# Patient Record
Sex: Male | Born: 1985 | Race: White | Hispanic: No | Marital: Married | Smoking: Never smoker
Health system: Southern US, Community
[De-identification: ages and names within clinical notes are randomized; demographics above are authoritative.]

---

## 2019-03-01 ENCOUNTER — Emergency Department (HOSPITAL_COMMUNITY): Payer: 59 | Admitting: Anesthesiology

## 2019-03-01 ENCOUNTER — Encounter (HOSPITAL_COMMUNITY)
Admission: EM | Disposition: A | Discharge status: Other Acute Inpatient Hospital | Payer: Self-pay | Source: Home / Self Care | Attending: Otolaryngology

## 2019-03-01 ENCOUNTER — Inpatient Hospital Stay (HOSPITAL_COMMUNITY)
Admission: EM | Admit: 2019-03-01 | Discharge: 2019-03-01 | DRG: 144 | Disposition: A | Discharge status: Other Acute Inpatient Hospital | Payer: 59 | Attending: Otolaryngology | Admitting: Otolaryngology

## 2019-03-01 ENCOUNTER — Inpatient Hospital Stay (HOSPITAL_COMMUNITY): Payer: 59

## 2019-03-01 ENCOUNTER — Emergency Department (HOSPITAL_COMMUNITY): Payer: 59

## 2019-03-01 ENCOUNTER — Other Ambulatory Visit: Payer: Self-pay

## 2019-03-01 ENCOUNTER — Encounter (HOSPITAL_COMMUNITY): Payer: Self-pay | Admitting: Emergency Medicine

## 2019-03-01 DIAGNOSIS — W1839XA Other fall on same level, initial encounter: Secondary | ICD-10-CM | POA: Diagnosis present

## 2019-03-01 DIAGNOSIS — S1121XA Laceration without foreign body of pharynx and cervical esophagus, initial encounter: Secondary | ICD-10-CM | POA: Diagnosis present

## 2019-03-01 DIAGNOSIS — Y92538 Other ambulatory health services establishments as the place of occurrence of the external cause: Secondary | ICD-10-CM | POA: Diagnosis present

## 2019-03-01 DIAGNOSIS — Z20828 Contact with and (suspected) exposure to other viral communicable diseases: Secondary | ICD-10-CM | POA: Diagnosis present

## 2019-03-01 DIAGNOSIS — Z880 Allergy status to penicillin: Secondary | ICD-10-CM

## 2019-03-01 DIAGNOSIS — Z419 Encounter for procedure for purposes other than remedying health state, unspecified: Secondary | ICD-10-CM

## 2019-03-01 DIAGNOSIS — T797XXA Traumatic subcutaneous emphysema, initial encounter: Secondary | ICD-10-CM | POA: Diagnosis present

## 2019-03-01 DIAGNOSIS — Y9389 Activity, other specified: Secondary | ICD-10-CM

## 2019-03-01 DIAGNOSIS — S1985XA Other specified injuries of pharynx and cervical esophagus, initial encounter: Secondary | ICD-10-CM

## 2019-03-01 DIAGNOSIS — R1310 Dysphagia, unspecified: Secondary | ICD-10-CM | POA: Diagnosis present

## 2019-03-01 DIAGNOSIS — J390 Retropharyngeal and parapharyngeal abscess: Secondary | ICD-10-CM | POA: Diagnosis present

## 2019-03-01 DIAGNOSIS — S1980XA Other specified injuries of unspecified part of neck, initial encounter: Secondary | ICD-10-CM

## 2019-03-01 DIAGNOSIS — Z978 Presence of other specified devices: Secondary | ICD-10-CM

## 2019-03-01 HISTORY — PX: ESOPHAGOSCOPY: SHX5534

## 2019-03-01 HISTORY — PX: DIRECT LARYNGOSCOPY: SHX5326

## 2019-03-01 LAB — MRSA PCR SCREENING: MRSA by PCR: NEGATIVE

## 2019-03-01 LAB — BASIC METABOLIC PANEL
Anion gap: 10 (ref 5–15)
BUN: 6 mg/dL (ref 6–20)
CO2: 25 mmol/L (ref 22–32)
Calcium: 8.6 mg/dL — ABNORMAL LOW (ref 8.9–10.3)
Chloride: 106 mmol/L (ref 98–111)
Creatinine, Ser: 0.74 mg/dL (ref 0.61–1.24)
GFR calc Af Amer: >60 mL/min (ref >60)
GFR calc non Af Amer: >60 mL/min (ref >60)
Glucose, Bld: 101 mg/dL — ABNORMAL HIGH (ref 70–99)
Potassium: 3.6 mmol/L (ref 3.5–5.1)
Sodium: 141 mmol/L (ref 135–145)

## 2019-03-01 LAB — LACTIC ACID, PLASMA: Lactic Acid, Venous: 0.9 mmol/L (ref 0.5–1.9)

## 2019-03-01 LAB — CBC WITH DIFFERENTIAL/PLATELET
Abs Immature Granulocytes: 0.04 10*3/uL (ref 0.00–0.07)
Basophils Absolute: 0 10*3/uL (ref 0.0–0.1)
Basophils Relative: 0 %
Eosinophils Absolute: 0 10*3/uL (ref 0.0–0.5)
Eosinophils Relative: 0 %
HCT: 35.7 % — ABNORMAL LOW (ref 39.0–52.0)
Hemoglobin: 12.3 g/dL — ABNORMAL LOW (ref 13.0–17.0)
Immature Granulocytes: 0 %
Lymphocytes Relative: 11 %
Lymphs Abs: 1.3 10*3/uL (ref 0.7–4.0)
MCH: 30.1 pg (ref 26.0–34.0)
MCHC: 34.5 g/dL (ref 30.0–36.0)
MCV: 87.3 fL (ref 80.0–100.0)
Monocytes Absolute: 1.1 10*3/uL — ABNORMAL HIGH (ref 0.1–1.0)
Monocytes Relative: 9 %
Neutro Abs: 9.4 10*3/uL — ABNORMAL HIGH (ref 1.7–7.7)
Neutrophils Relative %: 80 %
Platelets: 156 10*3/uL (ref 150–400)
RBC: 4.09 MIL/uL — ABNORMAL LOW (ref 4.22–5.81)
RDW: 11.9 % (ref 11.5–15.5)
WBC: 11.8 10*3/uL — ABNORMAL HIGH (ref 4.0–10.5)
nRBC: 0 % (ref 0.0–0.2)

## 2019-03-01 LAB — POCT I-STAT 7, (LYTES, BLD GAS, ICA,H+H)
Acid-Base Excess: 5 mmol/L — ABNORMAL HIGH (ref 0.0–2.0)
Bicarbonate: 28.8 mmol/L — ABNORMAL HIGH (ref 20.0–28.0)
Calcium, Ion: 1.14 mmol/L — ABNORMAL LOW (ref 1.15–1.40)
HCT: 33 % — ABNORMAL LOW (ref 39.0–52.0)
Hemoglobin: 11.2 g/dL — ABNORMAL LOW (ref 13.0–17.0)
O2 Saturation: 100 %
Patient temperature: 98.4
Potassium: 4.1 mmol/L (ref 3.5–5.1)
Sodium: 139 mmol/L (ref 135–145)
TCO2: 30 mmol/L (ref 22–32)
pCO2 arterial: 37.8 mmHg (ref 32.0–48.0)
pH, Arterial: 7.489 — ABNORMAL HIGH (ref 7.350–7.450)
pO2, Arterial: 474 mmHg — ABNORMAL HIGH (ref 83.0–108.0)

## 2019-03-01 LAB — RESPIRATORY PANEL BY RT PCR (FLU A&B, COVID)
Influenza A by PCR: NEGATIVE
Influenza B by PCR: NEGATIVE
SARS Coronavirus 2 by RT PCR: NEGATIVE

## 2019-03-01 SURGERY — LARYNGOSCOPY, DIRECT
Anesthesia: General | Site: Neck

## 2019-03-01 MED ORDER — MORPHINE SULFATE (PF) 4 MG/ML IV SOLN
4.0000 mg | Freq: Once | INTRAVENOUS | Status: AC
  Administered 2019-03-01: 4 mg via INTRAVENOUS
  Filled 2019-03-01: qty 1

## 2019-03-01 MED ORDER — FENTANYL CITRATE (PF) 100 MCG/2ML IJ SOLN: 50.0000 ug | Freq: Once | INTRAMUSCULAR | Status: DC

## 2019-03-01 MED ORDER — METRONIDAZOLE IN NACL 5-0.79 MG/ML-% IV SOLN: 500.0000 mg | Freq: Three times a day (TID) | INTRAVENOUS | Status: DC

## 2019-03-01 MED ORDER — LIDOCAINE-EPINEPHRINE 1 %-1:100000 IJ SOLN
INTRAMUSCULAR | Status: AC
  Filled 2019-03-01: qty 1

## 2019-03-01 MED ORDER — 0.9 % SODIUM CHLORIDE (POUR BTL) OPTIME
TOPICAL | Status: DC | PRN
  Administered 2019-03-01: 18:00:00 1000 mL

## 2019-03-01 MED ORDER — CEFAZOLIN SODIUM-DEXTROSE 2-4 GM/100ML-% IV SOLN
2.0000 g | Freq: Once | INTRAVENOUS | Status: AC
  Administered 2019-03-01: 2 g via INTRAVENOUS
  Filled 2019-03-01: qty 100

## 2019-03-01 MED ORDER — SODIUM CHLORIDE 0.9 % IV BOLUS
1000.0000 mL | Freq: Once | INTRAVENOUS | Status: AC
  Administered 2019-03-01: 1000 mL via INTRAVENOUS

## 2019-03-01 MED ORDER — WATER FOR IRRIGATION, STERILE IR SOLN
Status: DC | PRN
  Administered 2019-03-01: 1000 mL via SURGICAL_CAVITY

## 2019-03-01 MED ORDER — ORAL CARE MOUTH RINSE: 15.0000 mL | OROMUCOSAL | Status: DC

## 2019-03-01 MED ORDER — CLINDAMYCIN PHOSPHATE 900 MG/50ML IV SOLN
INTRAVENOUS | Status: AC
  Filled 2019-03-01: qty 50

## 2019-03-01 MED ORDER — FAMOTIDINE IN NACL 20-0.9 MG/50ML-% IV SOLN
20.0000 mg | Freq: Once | INTRAVENOUS | Status: AC
  Administered 2019-03-01: 20 mg via INTRAVENOUS
  Filled 2019-03-01: qty 50

## 2019-03-01 MED ORDER — CHLORHEXIDINE GLUCONATE 0.12% ORAL RINSE (MEDLINE KIT): 15.0000 mL | Freq: Two times a day (BID) | OROMUCOSAL | Status: DC

## 2019-03-01 MED ORDER — MIDAZOLAM HCL 2 MG/2ML IJ SOLN
INTRAMUSCULAR | Status: AC
  Filled 2019-03-01: qty 2

## 2019-03-01 MED ORDER — ONDANSETRON HCL 4 MG/2ML IJ SOLN
INTRAMUSCULAR | Status: AC
  Filled 2019-03-01: qty 2

## 2019-03-01 MED ORDER — METRONIDAZOLE IN NACL 5-0.79 MG/ML-% IV SOLN
500.0000 mg | Freq: Once | INTRAVENOUS | Status: AC
  Administered 2019-03-01: 500 mg via INTRAVENOUS
  Filled 2019-03-01: qty 100

## 2019-03-01 MED ORDER — PROPOFOL 1000 MG/100ML IV EMUL
INTRAVENOUS | Status: AC
  Filled 2019-03-01: qty 100

## 2019-03-01 MED ORDER — SODIUM CHLORIDE 0.9 % IV SOLN
2.0000 g | Freq: Three times a day (TID) | INTRAVENOUS | Status: DC
  Administered 2019-03-01: 2 g via INTRAVENOUS
  Filled 2019-03-01 (×4): qty 2

## 2019-03-01 MED ORDER — FENTANYL 2500MCG IN NS 250ML (10MCG/ML) PREMIX INFUSION
50.0000 ug/h | INTRAVENOUS | Status: DC
  Administered 2019-03-01: 50 ug/h via INTRAVENOUS
  Filled 2019-03-01: qty 250

## 2019-03-01 MED ORDER — PROPOFOL 10 MG/ML IV BOLUS
INTRAVENOUS | Status: DC | PRN
  Administered 2019-03-01: 30 mg via INTRAVENOUS
  Administered 2019-03-01: 50 ug/kg/min via INTRAVENOUS
  Administered 2019-03-01: 130 mg via INTRAVENOUS

## 2019-03-01 MED ORDER — ROCURONIUM BROMIDE 10 MG/ML (PF) SYRINGE
PREFILLED_SYRINGE | INTRAVENOUS | Status: AC
  Filled 2019-03-01: qty 10

## 2019-03-01 MED ORDER — ROCURONIUM BROMIDE 50 MG/5ML IV SOSY
PREFILLED_SYRINGE | INTRAVENOUS | Status: DC | PRN
  Administered 2019-03-01: 70 mg via INTRAVENOUS
  Administered 2019-03-01 (×2): 20 mg via INTRAVENOUS
  Administered 2019-03-01: 10 mg via INTRAVENOUS

## 2019-03-01 MED ORDER — LACTATED RINGERS IV SOLN: INTRAVENOUS | Status: DC | PRN

## 2019-03-01 MED ORDER — LIDOCAINE 2% (20 MG/ML) 5 ML SYRINGE
INTRAMUSCULAR | Status: AC
  Filled 2019-03-01: qty 5

## 2019-03-01 MED ORDER — CHLORHEXIDINE GLUCONATE CLOTH 2 % EX PADS: 6.0000 | MEDICATED_PAD | Freq: Every day | CUTANEOUS | Status: DC

## 2019-03-01 MED ORDER — LIDOCAINE-EPINEPHRINE 1 %-1:100000 IJ SOLN
INTRAMUSCULAR | Status: DC | PRN
  Administered 2019-03-01: 5.5 mL

## 2019-03-01 MED ORDER — LIDOCAINE 2% (20 MG/ML) 5 ML SYRINGE
INTRAMUSCULAR | Status: DC | PRN
  Administered 2019-03-01: 100 mg via INTRAVENOUS

## 2019-03-01 MED ORDER — DEXAMETHASONE SODIUM PHOSPHATE 10 MG/ML IJ SOLN
10.0000 mg | Freq: Once | INTRAMUSCULAR | Status: AC
  Administered 2019-03-01: 10 mg via INTRAVENOUS
  Filled 2019-03-01: qty 1

## 2019-03-01 MED ORDER — OXYMETAZOLINE HCL 0.05 % NA SOLN
NASAL | Status: DC | PRN
  Administered 2019-03-01: 1 via TOPICAL

## 2019-03-01 MED ORDER — FENTANYL CITRATE (PF) 250 MCG/5ML IJ SOLN
INTRAMUSCULAR | Status: AC
  Filled 2019-03-01: qty 5

## 2019-03-01 MED ORDER — ARTIFICIAL TEARS OPHTHALMIC OINT
TOPICAL_OINTMENT | OPHTHALMIC | Status: AC
  Filled 2019-03-01: qty 3.5

## 2019-03-01 MED ORDER — ARTIFICIAL TEARS OPHTHALMIC OINT
TOPICAL_OINTMENT | OPHTHALMIC | Status: DC | PRN
  Administered 2019-03-01: 1 via OPHTHALMIC

## 2019-03-01 MED ORDER — IOHEXOL 300 MG/ML  SOLN
75.0000 mL | Freq: Once | INTRAMUSCULAR | Status: AC | PRN
  Administered 2019-03-01: 75 mL via INTRAVENOUS

## 2019-03-01 MED ORDER — ONDANSETRON HCL 4 MG/2ML IJ SOLN
INTRAMUSCULAR | Status: DC | PRN
  Administered 2019-03-01: 4 mg via INTRAVENOUS

## 2019-03-01 MED ORDER — FENTANYL BOLUS VIA INFUSION
50.0000 ug | INTRAVENOUS | Status: DC | PRN
  Filled 2019-03-01: qty 50

## 2019-03-01 MED ORDER — FENTANYL CITRATE (PF) 100 MCG/2ML IJ SOLN
INTRAMUSCULAR | Status: DC | PRN
  Administered 2019-03-01: 50 ug via INTRAVENOUS
  Administered 2019-03-01: 150 ug via INTRAVENOUS
  Administered 2019-03-01: 50 ug via INTRAVENOUS

## 2019-03-01 MED ORDER — OXYMETAZOLINE HCL 0.05 % NA SOLN
NASAL | Status: AC
  Filled 2019-03-01: qty 30

## 2019-03-01 MED ORDER — MIDAZOLAM HCL 5 MG/5ML IJ SOLN
INTRAMUSCULAR | Status: DC | PRN
  Administered 2019-03-01 (×2): 2 mg via INTRAVENOUS

## 2019-03-01 MED ORDER — CLINDAMYCIN PHOSPHATE 900 MG/50ML IV SOLN
INTRAVENOUS | Status: DC | PRN
  Administered 2019-03-01: 900 mg via INTRAVENOUS

## 2019-03-01 MED ORDER — PROPOFOL 1000 MG/100ML IV EMUL
0.0000 ug/kg/min | INTRAVENOUS | Status: DC
  Filled 2019-03-01: qty 100

## 2019-03-01 SURGICAL SUPPLY — 32 items
BLADE 15 SAFETY STRL DISP (BLADE) ×4 IMPLANT
BNDG GAUZE ELAST 4 BULKY (GAUZE/BANDAGES/DRESSINGS) ×2 IMPLANT
CANISTER SUCT 3000ML PPV (MISCELLANEOUS) ×2 IMPLANT
CONT SPEC 4OZ CLIKSEAL STRL BL (MISCELLANEOUS) IMPLANT
COVER BACK TABLE 60X90IN (DRAPES) ×2 IMPLANT
COVER MAYO STAND STRL (DRAPES) ×2 IMPLANT
DRAPE HALF SHEET 40X57 (DRAPES) ×8 IMPLANT
DRAPE U-SHAPE 76X120 STRL (DRAPES) ×2 IMPLANT
DRAPE UNIVERSAL PACK (DRAPES) ×2 IMPLANT
FILTER STRAW FLUID ASPIR (MISCELLANEOUS) IMPLANT
GAUZE 4X4 16PLY RFD (DISPOSABLE) ×2 IMPLANT
GLOVE BIO SURGEON STRL SZ 6.5 (GLOVE) ×4 IMPLANT
GOWN SPEC L3 MED W/TWL (GOWN DISPOSABLE) ×8 IMPLANT
GUARD TEETH (MISCELLANEOUS) ×2 IMPLANT
KIT BASIN OR (CUSTOM PROCEDURE TRAY) ×2 IMPLANT
KIT TURNOVER KIT B (KITS) ×2 IMPLANT
NEEDLE FILTER BLUNT 18X 1/2SAF (NEEDLE)
NEEDLE FILTER BLUNT 18X1 1/2 (NEEDLE) IMPLANT
NEEDLE HYPO 25GX1X1/2 BEV (NEEDLE) ×2 IMPLANT
NS IRRIG 1000ML POUR BTL (IV SOLUTION) ×2 IMPLANT
PACK EENT II TURBAN DRAPE (CUSTOM PROCEDURE TRAY) ×2 IMPLANT
PAD ARMBOARD 7.5X6 YLW CONV (MISCELLANEOUS) ×4 IMPLANT
PATTIES SURGICAL .5 X.5 (GAUZE/BANDAGES/DRESSINGS) ×2 IMPLANT
SOL ANTI FOG 6CC (MISCELLANEOUS) ×1 IMPLANT
SOLUTION ANTI FOG 6CC (MISCELLANEOUS) ×1
SURGILUBE 2OZ TUBE FLIPTOP (MISCELLANEOUS) ×2 IMPLANT
SWAB CULTURE ESWAB REG 1ML (MISCELLANEOUS) ×4 IMPLANT
SYR 5ML LL (SYRINGE) ×2 IMPLANT
SYR CONTROL 10ML LL (SYRINGE) ×4 IMPLANT
TOWEL GREEN STERILE FF (TOWEL DISPOSABLE) ×4 IMPLANT
TUBE CONNECTING 12X1/4 (SUCTIONS) ×2 IMPLANT
WATER STERILE IRR 1000ML POUR (IV SOLUTION) ×2 IMPLANT

## 2019-03-01 NOTE — Consult Note (Addendum)
OTOLARYNGOLOGY CONSULTATION   Primary Care Physician: Patient, No Pcp Per Patient Location at Initial Consult: Emergency Department Chief Complaint/Reason for Consult: Odynophagia, concern for pharyngeal injury  History of Presenting Illness:    Thomas Higgins is a  33 y.o. male presenting with throat pain and odynophagia.  [facility] 02/26/2019 [facility] when he stood up and then had a syncopal episode hitting his anterior neck on a table/desk.  Since that time he has had difficulty swallowing.  There have been no voice changes.  There is no difficulty with breathing or shortness of breath.  He has not had fever night sweats or chills.  No history of esophageal problems or injuries.  He has a very difficult time swallowing and prefers to spit though he is able to swallow.  It is just very painful in the lower neck.  He is otherwise in good health.  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  History reviewed. No pertinent past medical history.  History reviewed. No pertinent surgical history.  No family history on file.  Social History   Socioeconomic History  . Marital status: Married    Spouse name: Not on file  . Number of children: Not on file  . Years of education: Not on file  . Highest education level: Not on file  Occupational History  . Not on file  Tobacco Use  . Smoking status: Never Smoker  . Smokeless tobacco: Never Used  Substance and Sexual Activity  . Alcohol use: Never  . Drug use: Never  . Sexual activity: Not on file  Other Topics Concern  . Not on file  Social History Narrative  . Not on file   Social Determinants of Health   Financial Resource Strain:   . Difficulty of Paying Living Expenses: Not on file  Food Insecurity:   . Worried About Programme researcher, broadcasting/film/videounning Out of Food in the Last Year: Not on file  . Ran Out of Food in the Last Year: Not on file  Transportation Needs:   . Lack of Transportation (Medical): Not on file  . Lack of  Transportation (Non-Medical): Not on file  Physical Activity:   . Days of Exercise per Week: Not on file  . Minutes of Exercise per Session: Not on file  Stress:   . Feeling of Stress : Not on file  Social Connections:   . Frequency of Communication with Friends and Family: Not on file  . Frequency of Social Gatherings with Friends and Family: Not on file  . Attends Religious Services: Not on file  . Active Member of Clubs or Organizations: Not on file  . Attends BankerClub or Organization Meetings: Not on file  . Marital Status: Not on file    No current facility-administered medications on file prior to encounter.   No current outpatient medications on file prior to encounter.    Allergies  Allergen Reactions  . Penicillins Rash     Review of Systems: Review of systems complete and is negative except for the above-mentioned.    OBJECTIVE: Vital Signs: Vitals:   03/01/19 0738  BP: 121/73  Pulse: 94  Resp: 14  Temp: 97.7 F (36.5 C)  SpO2: 97%    I&O No intake or output data in the 24 hours ending 03/01/19 1403  Physical Exam General: Well developed, well nourished. No acute distress. Voice without dysphonia.   Head/Face: Normocephalic, atraumatic. No scars or lesions. No sinus tenderness. Facial nerve intact and equal  bilaterally.  No facial lacerations. Salivary glands non tender and without palpable masses  Eyes: Globes well positioned, no proptosis Lids: No periorbital edema/ecchymosis. No lid laceration Conjunctiva: No chemosis, hemorrhage PERRL Extra occular movement: Full ROM bilaterally. No gaze restriction    Ears: No gross deformity. Normal external canal. Tympanic membrane intact bilaterally and without effusion  Hearing:  Normal speech reception.  Nose: No gross deformity or lesions. No purulent discharge. Septum midline. No turbinate hypertrophy.  Mouth/Oropharynx: Lips without any lesions. Dentition normal. No mucosal lesions within the oropharynx. No  tonsillar enlargement, exudate, or lesions. Pharyngeal walls symmetrical. Uvula midline. Tongue midline without lesions.  Neck: Trachea midline. No masses. No thyromegaly or nodules palpated. No crepitus. There is a small excoriation in the neck extending just to the right of midline that is clean dry and intact.  Lymphatic: No lymphadenopathy in the neck.  Respiratory:  Normal voice.  No dysphonia or stridor.  Symmetric chest wall expansion.  Cardiovascular: Regular rate and rhythm.  Extremities: No edema or cyanosis. Warm and well-perfused.  Skin: No scars or lesions on face or neck.  Neurologic: CN II-XII intact. Moving all extremities without gross abnormality.  Other:      Labs: No results found for: WBC, HGB, HCT, PLT, CHOL, TRIG, HDL, LDLDIRECT, ALT, AST, NA, K, CL, CREATININE, BUN, CO2, TSH, PSA, INR, GLUF, HGBA1C, MICROALBUR  Procedure: Transnasal Flexible Laryngoscopy  Preoperative Diagnosis: Pharyngeal injury Postoperative Diagnosis: Same Procedure: Transnasal fiberoptic laryngoscopy.  Estimated Blood Loss: 0 mL.  Complications: None.  Findings: The nasal cavity and nasopharynx are unremarkable. There are no suspicious findings in the nasopharynx or Fossa of Rosenmller. The tongue base, pharyngeal walls, piriform sinuses, vallecula, epiglottis and postcricoid region are normal in appearance. The visualized portion of the subglottis and proximal trachea is widely patent. The vocal folds are mobile bilaterally. There are no lesions or significant inflammation. There is no glottal insufficiency. There is no pooling of secretions or aspiration. There is swelling along the posterior pharynx more pronounced on the right at approximately the level of the epiglottis.  However, the epiglottis and supraglottic structures and airway remained completely normal in appearance and without any edema.  Description of Procedure: With the patient in the sitting position, topical  Afrin-lidocaine  mixture  was applied to the nose. The scope was passed through the nose. Examination was carried out of the nose, nasopharynx, oropharynx, hypopharynx, and larynx with findings as noted above. Scope was removed.  The patient tolerated the procedure well.      Review of Ancillary Data / Diagnostic Tests: CT neck personally reviewed.  There appears to be likely a right pharyngeal injury with subsequent subcutaneous emphysema and retropharyngeal fluid collection extending into the retropharyngeal space and abutting the carotid spaces, right more so than left.  Extraluminal air extends to the thoracic inlet but not beyond this.  There is no pneumomediastinum obvious on my exam.  There is no rim enhancement to this fluid collection.  ASSESSMENT:  33 y.o. male with odynophagia after pharyngeal injury from syncopal episode after donating blood on 02/26/2019.  He is afebrile.  RECOMMENDATIONS: -Strict NPO for at least the next 72 hours. Will obtain esophagram later today and prior to any feeding trials in a few days if clinically improved.  He will need a Dobbhoff feeding tube which I will place in the operating room under direct visualization.  We will plan for direct laryngoscopy and esophagoscopy. Possible external incision and drainage if there is active purulence coming  from the retropharyngeal perforation.  We discussed risks and benefits of surgery including risks of anesthesia, airway compromise, need for further surgeries, risk of damage to lips teeth or tongue, esophageal perforation and potential pneumothorax.  He expresses understanding and gave informed consent. -He will be admitted and placed on IV antibiotics.  I have called and personally discussed this case with Dr. Johnnye Sima, infectious disease.  We will proceed with Unasyn as antibiotic management for now.     Gavin Pound, MD  [facility], [place] Office phone  [phone]

## 2019-03-01 NOTE — ED Provider Notes (Signed)
[facility] Provider Note   CSN: [id number] Arrival date & time: 03/01/19  [id number]     History Chief Complaint  Patient presents with  . throat pain s/p fall    Thomas Higgins is a 33 y.o. male.  Patient s/p syncopal event post donating blood 12/24, c/o anterior neck pain s/p neck contusion. With syncopal event, hit anterior neck on edge of counter. Since then, constant pain to area, worse w swallowing. States area feels swollen. Symptoms acute onset with contusion to neck, constant, persistent, moderate, dull, non radiating. Is able to swallow since, but painful, and mainly sticking to liquids. No choking, gagging or coughing. No trouble breathing. Denies headache. No posterior neck pain or back pain. Denies any other pain or injury. w syncopal event, felt somewhat lightheaded post donating blood, felt a bit better, tried to get up/walk, and had fainting episode. No associated palpitations or chest pain/discomfort.   The history is provided by the patient.       History reviewed. No pertinent past medical history.  There are no problems to display for this patient.   History reviewed. No pertinent surgical history.     No family history on file.  Social History   Tobacco Use  . Smoking status: Never Smoker  . Smokeless tobacco: Never Used  Substance Use Topics  . Alcohol use: Never  . Drug use: Never    Home Medications Prior to Admission medications   Not on File    Allergies    Patient has no allergy information on record.  Review of Systems   Review of Systems  Constitutional: Negative for fever.  HENT: Negative for voice change.   Eyes: Negative for redness.  Respiratory: Negative for cough and shortness of breath.   Cardiovascular: Negative for chest pain.  Gastrointestinal: Negative for abdominal pain and vomiting.  Genitourinary: Negative for flank pain.  Musculoskeletal: Negative for back pain.       No spine  or posterior neck pain.   Skin: Negative for rash.  Neurological: Negative for weakness, numbness and headaches.  Hematological: Does not bruise/bleed easily.  Psychiatric/Behavioral: Negative for confusion.    Physical Exam Updated Vital Signs BP 121/73 (BP Location: Left Arm)   Pulse 94   Temp 97.7 F (36.5 C) (Oral)   Resp 14   SpO2 97%   Physical Exam Vitals and nursing note reviewed.  Constitutional:      Appearance: Normal appearance. He is well-developed.  HENT:     Head: Atraumatic.     Nose: Nose normal.     Mouth/Throat:     Mouth: Mucous membranes are moist.     Pharynx: Oropharynx is clear. No oropharyngeal exudate or posterior oropharyngeal erythema.  Eyes:     Conjunctiva/sclera: Conjunctivae normal.  Neck:     Trachea: No tracheal deviation.     Comments: Trachea midline. No crepitus. No bruits.  Cardiovascular:     Rate and Rhythm: Normal rate and regular rhythm.     Pulses: Normal pulses.     Heart sounds: Normal heart sounds. No murmur. No friction rub. No gallop.   Pulmonary:     Effort: Pulmonary effort is normal. No accessory muscle usage or respiratory distress.     Breath sounds: Normal breath sounds. No stridor.  Abdominal:     General: There is no distension.     Palpations: Abdomen is soft.     Tenderness: There is no abdominal tenderness.  Genitourinary:  Comments: No cva tenderness. Musculoskeletal:        General: No swelling.     Cervical back: Normal range of motion and neck supple. No rigidity.  Skin:    General: Skin is warm and dry.     Findings: No rash.  Neurological:     Mental Status: He is alert.     Comments: Alert, speech clear/normal. Steady gait.   Psychiatric:        Mood and Affect: Mood normal.     ED Results / Procedures / Treatments   Labs (all labs ordered are listed, but only abnormal results are displayed) Results for orders placed or performed during the hospital encounter of 03/01/19  Lactic acid    Result Value Ref Range   Lactic Acid, Venous 0.9 0.5 - 1.9 mmol/L  CBC with Differential  Result Value Ref Range   WBC 11.8 (H) 4.0 - 10.5 K/uL   RBC 4.09 (L) 4.22 - 5.81 MIL/uL   Hemoglobin 12.3 (L) 13.0 - 17.0 g/dL   HCT 35.7 (L) 39.0 - 52.0 %   MCV 87.3 80.0 - 100.0 fL   MCH 30.1 26.0 - 34.0 pg   MCHC 34.5 30.0 - 36.0 g/dL   RDW 11.9 11.5 - 15.5 %   Platelets 156 150 - 400 K/uL   nRBC 0.0 0.0 - 0.2 %   Neutrophils Relative % 80 %   Neutro Abs 9.4 (H) 1.7 - 7.7 K/uL   Lymphocytes Relative 11 %   Lymphs Abs 1.3 0.7 - 4.0 K/uL   Monocytes Relative 9 %   Monocytes Absolute 1.1 (H) 0.1 - 1.0 K/uL   Eosinophils Relative 0 %   Eosinophils Absolute 0.0 0.0 - 0.5 K/uL   Basophils Relative 0 %   Basophils Absolute 0.0 0.0 - 0.1 K/uL   Immature Granulocytes 0 %   Abs Immature Granulocytes 0.04 0.00 - 0.07 K/uL  BMET  Result Value Ref Range   Sodium 141 135 - 145 mmol/L   Potassium 3.6 3.5 - 5.1 mmol/L   Chloride 106 98 - 111 mmol/L   CO2 25 22 - 32 mmol/L   Glucose, Bld 101 (H) 70 - 99 mg/dL   BUN 6 6 - 20 mg/dL   Creatinine, Ser 0.74 0.61 - 1.24 mg/dL   Calcium 8.6 (L) 8.9 - 10.3 mg/dL   GFR calc non Af Amer >60 >60 mL/min   GFR calc Af Amer >60 >60 mL/min   Anion gap 10 5 - 15   CT Soft Tissue Neck W Contrast  Result Date: 03/01/2019 CLINICAL DATA:  33 year old male status post syncope while donating blood and fall striking anterior neck on counter top. Painful swallowing. EXAM: CT NECK WITH CONTRAST TECHNIQUE: Multidetector CT imaging of the neck was performed using the standard protocol following the bolus administration of intravenous contrast. CONTRAST:  79mL OMNIPAQUE IOHEXOL 300 MG/ML  SOLN COMPARISON:  None. FINDINGS: Pharynx and larynx: The hyoid bone and thyroid cartilage seem to remain intact. However, there is abnormal retropharyngeal space fluid and gas tracking throughout the neck. See sagittal image 57. The retropharyngeal effusion and/or edema tracks laterally  from the retropharyngeal space to involve both carotid spaces. See series 3, image 49. Gas tracks inferiorly to the thoracic inlet mostly along the cervical esophagus, but the volume of gas dissipates caudally and there is no superior pneumomediastinum. There is an unusual contour of the posterior wall of the lower oropharynx or hypopharynx on series 3, image 47. But otherwise no  discrete pharyngeal or laryngeal contour abnormality. Laryngeal cartilages appear symmetric and within normal limits. There is mild inflammation in the superior right parapharyngeal space, where as the superior left parapharyngeal space remains normal. Salivary glands: Negative sublingual space, sublingual glands. The submandibular glands appear symmetric and within normal limits although there is some fluid or edema in the posterior right submandibular space. Parotid glands appear symmetric and negative. Thyroid: Negative aside from trace adjacent gas. Lymph nodes: Right level 2 lymph nodes are asymmetrically enlarged and appear reactive, individually up to 11 millimeters short axis. No cystic or necrotic nodes. Vascular: Good intravascular contrast. Three vessel arch configuration. Right CCA and carotid bifurcation are normal. Cervical right ICA is within normal limits. Left CCA and left carotid bifurcation are normal. Cervical left ICA is within normal limits. Both internal jugular veins are patent. The vertebral arteries are codominant and appear within normal limits to the skull base. The right vertebral has a late entry into the cervical transverse foramen. Limited intracranial: Negative. Visualized orbits: Negative. Mastoids and visualized paranasal sinuses: Clear throughout. Skeleton: Mandible intact. No acute dental abnormality identified. Straightening of cervical lordosis but otherwise normal cervical vertebral alignment. No osseous abnormality identified. Upper chest: No pneumothorax in the visible upper lungs are clear.  Negative visible superior mediastinum, no fluid or pneumomediastinum. IMPRESSION: 1. Positive for evidence of penetrating trauma to the pharynx or larynx with abnormal retropharyngeal space gas and fluid/edema tracking throughout the neck. The gas dissipates at the thoracic inlet and the visible upper chest is negative. The injury site might be the posterior pharyngeal wall to the right of midline on series 3, image 47 where an unusual mucosal contour is noted. The hyoid bone and thyroid cartilage seem to remain intact. 2. Secondary inflammation and/or fluid extending laterally from the retropharyngeal space into the bilateral parapharyngeal and carotid spaces. No vascular abnormality identified. Mild inflammation also in the right submandibular space. 3. No other acute traumatic injury identified. Electronically Signed   By: Odessa Fleming M.D.   On: 03/01/2019 11:02    EKG None  Radiology CT Soft Tissue Neck W Contrast  Result Date: 03/01/2019 CLINICAL DATA:  33 year old male status post syncope while donating blood and fall striking anterior neck on counter top. Painful swallowing. EXAM: CT NECK WITH CONTRAST TECHNIQUE: Multidetector CT imaging of the neck was performed using the standard protocol following the bolus administration of intravenous contrast. CONTRAST:  75mL OMNIPAQUE IOHEXOL 300 MG/ML  SOLN COMPARISON:  None. FINDINGS: Pharynx and larynx: The hyoid bone and thyroid cartilage seem to remain intact. However, there is abnormal retropharyngeal space fluid and gas tracking throughout the neck. See sagittal image 57. The retropharyngeal effusion and/or edema tracks laterally from the retropharyngeal space to involve both carotid spaces. See series 3, image 49. Gas tracks inferiorly to the thoracic inlet mostly along the cervical esophagus, but the volume of gas dissipates caudally and there is no superior pneumomediastinum. There is an unusual contour of the posterior wall of the lower oropharynx or  hypopharynx on series 3, image 47. But otherwise no discrete pharyngeal or laryngeal contour abnormality. Laryngeal cartilages appear symmetric and within normal limits. There is mild inflammation in the superior right parapharyngeal space, where as the superior left parapharyngeal space remains normal. Salivary glands: Negative sublingual space, sublingual glands. The submandibular glands appear symmetric and within normal limits although there is some fluid or edema in the posterior right submandibular space. Parotid glands appear symmetric and negative. Thyroid: Negative aside from trace adjacent gas.  Lymph nodes: Right level 2 lymph nodes are asymmetrically enlarged and appear reactive, individually up to 11 millimeters short axis. No cystic or necrotic nodes. Vascular: Good intravascular contrast. Three vessel arch configuration. Right CCA and carotid bifurcation are normal. Cervical right ICA is within normal limits. Left CCA and left carotid bifurcation are normal. Cervical left ICA is within normal limits. Both internal jugular veins are patent. The vertebral arteries are codominant and appear within normal limits to the skull base. The right vertebral has a late entry into the cervical transverse foramen. Limited intracranial: Negative. Visualized orbits: Negative. Mastoids and visualized paranasal sinuses: Clear throughout. Skeleton: Mandible intact. No acute dental abnormality identified. Straightening of cervical lordosis but otherwise normal cervical vertebral alignment. No osseous abnormality identified. Upper chest: No pneumothorax in the visible upper lungs are clear. Negative visible superior mediastinum, no fluid or pneumomediastinum. IMPRESSION: 1. Positive for evidence of penetrating trauma to the pharynx or larynx with abnormal retropharyngeal space gas and fluid/edema tracking throughout the neck. The gas dissipates at the thoracic inlet and the visible upper chest is negative. The injury site  might be the posterior pharyngeal wall to the right of midline on series 3, image 47 where an unusual mucosal contour is noted. The hyoid bone and thyroid cartilage seem to remain intact. 2. Secondary inflammation and/or fluid extending laterally from the retropharyngeal space into the bilateral parapharyngeal and carotid spaces. No vascular abnormality identified. Mild inflammation also in the right submandibular space. 3. No other acute traumatic injury identified. Electronically Signed   By: Odessa Fleming M.D.   On: 03/01/2019 11:02    Procedures Procedures (including critical care time)  Medications Ordered in ED Medications - No data to display  ED Course  I have reviewed the triage vital signs and the nursing notes.  Pertinent labs & imaging results that were available during my care of the patient were reviewed by me and considered in my medical decision making (see chart for details).    MDM Rules/Calculators/A&P                      CT imaging ordered.   Reviewed nursing notes and prior charts for additional history.   Iv ns bolus. Patient requests antacid medication. pepcid iv.   CT reviewed/interpreted by me - ?pharyngeal injury with air in retropharyngeal space and associated inflammatory change.   ENT consulted. Recheck pt, comfortable, no distress, no stridor, no acute progression of symptoms since initial ED eval.  repaged ENT - await call back.  Discussed pt with Dr Merril Abbe - she will be right in to see, plan for bedside scope followed by swallow study. She requests labs, covid swab and iv abx, and decadron - Ancef 2 gm iv, flagyl iv, and Decadron iv, given per ENT request.   Pt kept npo. Additional ivf. Morphine for pain.   Labs reviewed/interpreted by me - lactate is normal.   ENT evaluated in ED, bedside NP scope with plan to take to OR for definitive evaluation and management. Discussed possible swallow study with Dr Merril Abbe - she indicates plans to take to OR  first for thorough direct visualization/tx, and may obtain swallow study subsequent to OR - she will admit, iv abx, ID consulted by Dr Merril Abbe.        Final Clinical Impression(s) / ED Diagnoses Final diagnoses:  None    Rx / DC Orders ED Discharge Orders    None       Cathren Laine,  MD 03/01/19 1526

## 2019-03-01 NOTE — Progress Notes (Signed)
[facility]Link Physician-Brief Progress Note Patient Name: Thomas Higgins DOB: August 27, 1985 MRN: [id number]   Date of Service  03/01/2019  HPI/Events of Note  33 year old man with pharyngeal injury s/p repair by ENT. Now in ICU. Has arrived from OR on propofol and RN requested an order to continue it.   eICU Interventions  Order placed, bedside ICU team notified and will evaluate. Please call for any questions     Intervention Category Major Interventions: Respiratory failure - evaluation and management Evaluation Type: New Patient Evaluation  Margaretmary Lombard 03/01/2019, 8:24 PM

## 2019-03-01 NOTE — Anesthesia Postprocedure Evaluation (Signed)
Anesthesia Post Note  Patient: Thomas Higgins  Procedure(s) Performed: DIRECT LARYNGOSCOPY IRRIGATION AND DEBRIDEMENT OF NECK, RIGHT (N/A Neck) ESOPHAGOSCOPY, DOBBHOFF FEEDING PLACEMENT (N/A Neck)     Patient location during evaluation: SICU Anesthesia Type: General Level of consciousness: sedated Pain management: pain level controlled Vital Signs Assessment: post-procedure vital signs reviewed and stable Respiratory status: patient remains intubated per anesthesia plan Cardiovascular status: stable Postop Assessment: no apparent nausea or vomiting Anesthetic complications: no    Last Vitals:  Vitals:   03/01/19 2145 03/01/19 2200  BP: 122/68   Pulse: 77 79  Resp: 15 15  Temp:    SpO2: 100% 99%    Last Pain:  Vitals:   03/01/19 2030  TempSrc: Axillary  PainSc:                  [place] DAVID

## 2019-03-01 NOTE — Progress Notes (Signed)
PCCM progress note:  Pt transferring to [facility] for tertiary care, confirmed that patient is being sent by Dr. Richmond Campbell and accepted to the surgical ICU by Dr. Sabra Heck.  Pt stabilized on minimal vent settings.  [facility] re-evaluation completed and [facility] at the bedside for transfer.

## 2019-03-01 NOTE — Transfer of Care (Signed)
Immediate Anesthesia Transfer of Care Note  Patient: Thomas Higgins  Procedure(s) Performed: DIRECT LARYNGOSCOPY IRRIGATION AND DEBRIDEMENT OF NECK, RIGHT (N/A Neck) ESOPHAGOSCOPY, DOBBHOFF FEEDING PLACEMENT (N/A Neck)  Patient Location: ICU  Anesthesia Type:General  Level of Consciousness: sedated, unresponsive and Patient remains intubated per anesthesia plan  Airway & Oxygen Therapy: Patient remained intubated per Surgeon request, placed on vent by RT. +BBS confirmed.  Post-op Assessment: Report given to RN and Post -op Vital signs reviewed and stable  Post vital signs: Reviewed and stable  Last Vitals:  Vitals Value Taken Time  BP    Temp    Pulse 88 03/01/19 1856  Resp 15 03/01/19 1856  SpO2 100 % 03/01/19 1856  Vitals shown include unvalidated device data.  Last Pain:  Vitals:   03/01/19 0757  TempSrc:   PainSc: 7          Complications: No apparent anesthesia complications

## 2019-03-01 NOTE — Anesthesia Preprocedure Evaluation (Addendum)
Anesthesia Evaluation  Patient identified by MRN, date of birth, ID band Patient awake    Reviewed: Allergy & Precautions, NPO status , Patient's Chart, lab work & pertinent test results  Airway Mallampati: I  TM Distance: >3 FB Neck ROM: Full    Dental  (+) Teeth Intact, Dental Advisory Given   Pulmonary    Pulmonary exam normal        Cardiovascular Normal cardiovascular exam     Neuro/Psych    GI/Hepatic   Endo/Other    Renal/GU      Musculoskeletal   Abdominal   Peds  Hematology   Anesthesia Other Findings   Reproductive/Obstetrics                            Anesthesia Physical Anesthesia Plan  ASA: II and emergent  Anesthesia Plan: General   Post-op Pain Management:    Induction: Intravenous  PONV Risk Score and Plan: 2 and Ondansetron and Midazolam  Airway Management Planned: Oral ETT  Additional Equipment:   Intra-op Plan:   Post-operative Plan: Extubation in OR  Informed Consent: I have reviewed the patients History and Physical, chart, labs and discussed the procedure including the risks, benefits and alternatives for the proposed anesthesia with the patient or authorized representative who has indicated his/her understanding and acceptance.       Plan Discussed with: CRNA and Surgeon  Anesthesia Plan Comments:         Anesthesia Quick Evaluation

## 2019-03-01 NOTE — Anesthesia Procedure Notes (Signed)
Procedure Name: Intubation Date/Time: 03/01/2019 4:43 PM Performed by: Suzy Bouchard, CRNA Pre-anesthesia Checklist: Patient identified, Emergency Drugs available, Suction available, Patient being monitored and Timeout performed Patient Re-evaluated:Patient Re-evaluated prior to induction Oxygen Delivery Method: Circle system utilized Preoxygenation: Pre-oxygenation with 100% oxygen Induction Type: IV induction Ventilation: Mask ventilation without difficulty Laryngoscope Size: Miller and 2 Grade View: Grade I Tube type: MLT Tube size: 6.0 mm Number of attempts: 1 Airway Equipment and Method: Stylet Placement Confirmation: ETT inserted through vocal cords under direct vision,  positive ETCO2 and breath sounds checked- equal and bilateral Secured at: 23 cm Tube secured with: Tape Dental Injury: Teeth and Oropharynx as per pre-operative assessment  Comments: Small bleeding spot on right corner of lips,  Ointment applied- lips dry.

## 2019-03-01 NOTE — Op Note (Signed)
DATE OF PROCEDURE: 03/01/2019   PRE-OPERATIVE DIAGNOSIS: Pharyngeal injury Retropharyngeal fluid collection   POST-OPERATIVE DIAGNOSIS: Same   PROCEDURE(S): Direct laryngoscopy with operating telescope Transoral drainage of retropharyngeal abscess External incision and drainage of retropharyngeal fluid collection   SURGEON:  Misty Stanley, MD    ASSISTANT(S): Flo Shanks, MD, who was present for external incision and drainage to aid in retraction and surgical decision making    ANESTHESIA: General endotracheal anesthesia     ESTIMATED BLOOD LOSS: 15 mL  SPECIMENS: None   COMPLICATIONS: None    OPERATIVE FINDINGS: This patient had a retropharyngeal fluid collection noted on CT scan.  This extended towards the right carotid sheath and abuts the left carotid sheath. Intraoperatively, the right posterior pharyngeal wall demonstrated a long linear laceration estimated to be about 2-1/2 cm with fibrinous debris and purulence extruding from this wound bed.  This seemed to begin at about the height of the top of the epiglottis and extended down along the posterior right pharyngeal wall to the level of the arytenoid.  There was minimal posterior arytenoid edema.  The true cords did not demonstrate any edema.  Wound cultures were obtained transorally through the laryngoscope into this wound bed. On external incision, there was no frank purulence identified.  However, pharyngeal wound was identified externally.  There was no palpable crepitus.   OPERATIVE DETAILS: The patient was taken to the operating room and placed in the supine position.  General anesthesia was induced.  A timeout was performed.  The patient was successfully intubated by anesthesia.  The bed was then rotated 90 degrees towards the surgeon.  Eye pads eye tape and tooth guard were placed.  The patient's face was protected with towels.  The Dedo laryngoscope was introduced and utilized to visualize the  soft palate tonsils, posterior pharynx, epiglottis, supraglottis and cords as noted above.  Next, the laryngoscope was removed.  The rigid esophagoscope was then introduced.  This was utilized to visualize the esophagus.  There was no primary esophageal injury.  The esophagoscope was then removed.  Laryngoscope was reinserted and photodocumentation was obtained of the posterior pharyngeal injury.  The wound bed was gently probed with a blunt probe.  Frank purulence was extruded and wound cultures were obtained.  Next, a Dobbhoff feeding tube was placed in the left naris and visualized to go down the esophagus.  The laryngoscope was then removed.  A stat x-ray was obtained and demonstrated the feeding tube to be in the stomach.  Next, the patient was rotated back towards anesthesia.  A shoulder roll was placed.  The patient's right neck was prepped and draped in the usual sterile fashion.  Incision was designed 2 fingerbreadths below the mandible taking care to avoid the marginal mandibular nerve.  1% lidocaine with 1 100,000 epinephrine was injected subcutaneously.  An incision was made with a 15 blade through the skin and soft tissue and platysma.  A subplatysmal flap was raised superiorly.  Dissection was carried anterior to the great vessels until the pharyngeal wound was encountered.  The wound bed was copiously irrigated with normal saline. A 1/4" penrose drain was placed and secured with a 3-0 nylon suture.  The lateral portion of the wound bed skin was reapproximated with loose simple interrupted 3-0 nylon sutures.  Patient skin was cleansed.  A Kerlix fluff was placed in a Byrnett was utilized to hold this in place.  All instrumentation was then removed.  The decision was made to leave the  patient intubated due to concerns for airway swelling overnight.  He will go to the ICU.  I have personally called and discussed his case with the intensivist.  I have also called [facility] and spoken with Dr.  Sabra Heck, who has agreed to accept the patient at the [facility] surgical ICU.  He will go to our ICU briefly and then be transferred to the [facility] surgical ICU.  Helayne Seminole , MD

## 2019-03-01 NOTE — ED Triage Notes (Signed)
Pt had syncopal episode on 12/24 after donating blood at [facility].  He fell and hit throat on counter.  C/o continued pain to throat and difficulty swallowing.  Denies SOB but states he may have had mild SOB while trying to sleep.  Received COVID vaccine on Tuesday.

## 2019-03-02 ENCOUNTER — Encounter: Payer: Self-pay | Admitting: *Deleted

## 2019-03-02 NOTE — Discharge Summary (Signed)
Physician Discharge Summary  Patient ID: Thomas Higgins MRN: [id number] DOB/AGE: 1986/02/02 33 y.o.  Admit date: 03/01/2019 Discharge date: 03/02/2019  Admission Diagnoses: traumatic pharyngeal injury  Discharge Diagnoses:  Active Problems:   Dysphagia   Discharged Condition: serious  Hospital Course: This patient presented to the [facility] on 03/01/19. He was donating blood on 02/26/19 when he stood up and had a syncopal episode, hitting his anterior neck. He then developed worsening odynophagia. On presentation to the Emergency Department, he had mild leukocytosis to 11.8. CT demonstrated retropharyngeal fluid collection extending to R>L carotid spaces, free air in the prevertebral space and likely right posterior pharyngeal injury. He was subsequently taken to the operating room for direct laryngoscopy, esophagoscopy, and external incision and drainage. He was left intubated due to arytenoid swelling. He had a low grade temperature at this point (99.5). Details of the surgery are dictated in a separate operative report. He was sent to the ICU post operatively. Critical Care was consulted for ICU management.  [place],. Dr. Sabra Heck, was contacted and agreed transfer to tertiary care facility was more appropriate. The patient is to be transported intubated via ambulance to [facility] Surgical ICU.   Consults:  Critical Care, Infectious Disease (curbside)  Significant Diagnostic Studies:    Lab Orders     Respiratory Panel by RT PCR (Flu A&B, Covid) - Nasopharyngeal Swab     Aerobic/Anaerobic Culture (surgical/deep wound)     MRSA PCR Screening     Culture, blood (routine x 2)     Lactic acid     CBC with Differential     BMET     Triglycerides     I-STAT 7, (LYTES, BLD GAS, ICA, H+H)   CT neck W contrast "IMPRESSION: 1. Positive for evidence of penetrating trauma to the pharynx or larynx with abnormal retropharyngeal space gas and  fluid/edema tracking throughout the neck. The gas dissipates at the thoracic inlet and the visible upper chest is negative.  The injury site might be the posterior pharyngeal wall to the right of midline on series 3, image 47 where an unusual mucosal contour is noted. The hyoid bone and thyroid cartilage seem to remain intact.  2. Secondary inflammation and/or fluid extending laterally from the retropharyngeal space into the bilateral parapharyngeal and carotid spaces. No vascular abnormality identified. Mild inflammation also in the right submandibular space.  3. No other acute traumatic injury identified.   Electronically Signed   By: Genevie Ann M.D.   On: 03/01/2019 11:02"  Treatments:  In ED- patient received 10mg  IV decadron, Ancef/flagyl In OR- patient received 900mg  clindamycin In ICU, upon discussion with accepting Dr. Sabra Heck, patient was placed on IV cefepime and flagyl.   Surgery 03/01/19 as dictated separately.   Discharge Exam: Blood pressure 122/68, pulse 79, temperature 97.8 F (36.6 C), temperature source Axillary, resp. rate 15, height 5\' 8"  (1.727 m), weight 79.5 kg, SpO2 99 %. General appearance: intuabted, sedated Head: Normocephalic, without obvious abnormality, atraumatic Eyes: conjunctivae/corneas clear. PERRL, EOM's intact. Fundi benign. Ears: normal TM's and external ear canals both ears Nose: Nares normal. Septum midline. Mucosa normal. No drainage or sinus tenderness. Throat: lips, mucosa, and tongue normal; teeth and gums normal and swelling mild posterior right oropharynx, no trismus, tonsils normal Neck: moderate right sided edema, right level 2 adenopathy, penrose secured right neck, incision closed and intact with nylon sutures, dry dressing placed with some mild sanguinous drainage Resp: clear to auscultation bilaterally Chest  wall: no tenderness GI: soft, non-tender; bowel sounds normal; no masses,  no organomegaly Extremities: extremities  normal, atraumatic, no cyanosis or edema Pulses: 2+ and symmetric  Disposition: Discharge disposition: [facility] Not Defined      To Surgical ICU at [facility]  Allergies as of 03/02/2019      Reactions   Cephalosporins Rash   rash   Penicillins Rash      Medication List    TAKE these medications   acetaminophen 500 MG tablet Commonly known as: TYLENOL Take 500 mg by mouth every 6 (six) hours as needed for moderate pain.   ibuprofen 200 MG tablet Commonly known as: ADVIL Take 200 mg by mouth every 6 (six) hours as needed for moderate pain.        Signed: Graylin Shiver, MD 03/02/2019, 10:50 AM

## 2019-03-06 LAB — AEROBIC/ANAEROBIC CULTURE W GRAM STAIN (SURGICAL/DEEP WOUND)

## 2021-01-05 IMAGING — CT CT NECK W/ CM
3 of 6 series · 11 of 35 positions shown, 13 images · IV contrast (omnipaque)
Comparison: None.

CLINICAL DATA: 33-year-old male status post syncope while donating
blood and fall striking anterior neck on counter top. Painful
swallowing.

EXAM:
CT NECK WITH CONTRAST
TECHNIQUE: Multidetector CT imaging of the neck was performed using the
standard protocol following the bolus administration of intravenous
contrast.
CONTRAST:  75mL OMNIPAQUE IOHEXOL 300 MG/ML  SOLN

[Series 6: sag neck · sagittal · 0.50mm/px · 5 of 116 slices shown, 6 images]
[im 39/116  bone]
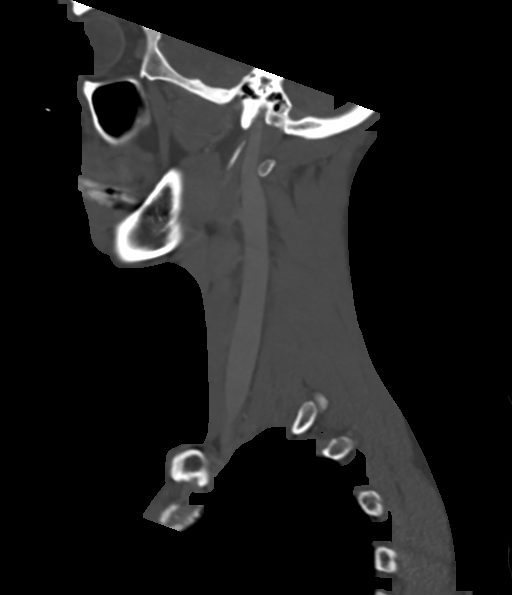
[im 48/116  bone]
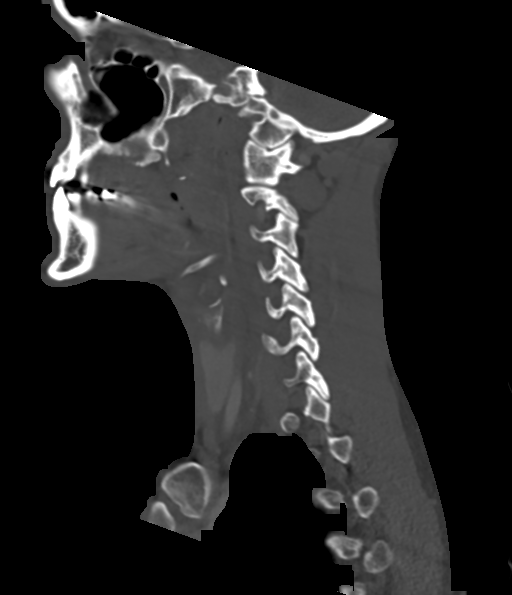
[im 58/116  soft-tissue]
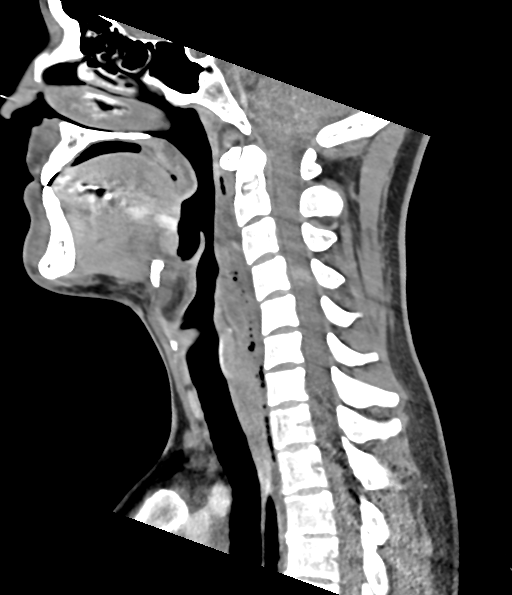
[im 58/116  bone]
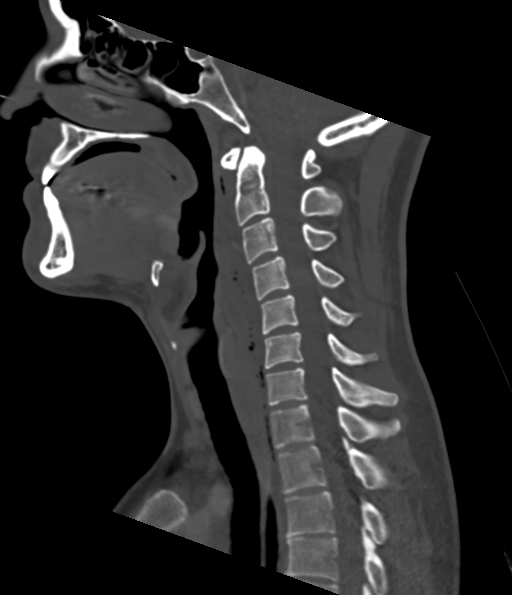
[im 68/116  bone]
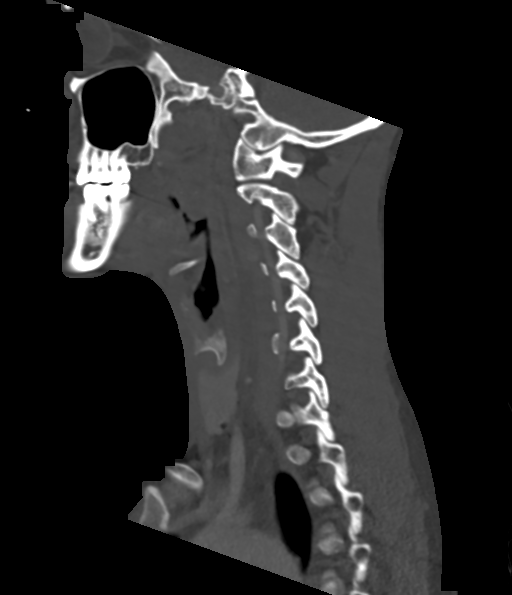
[im 77/116  bone]
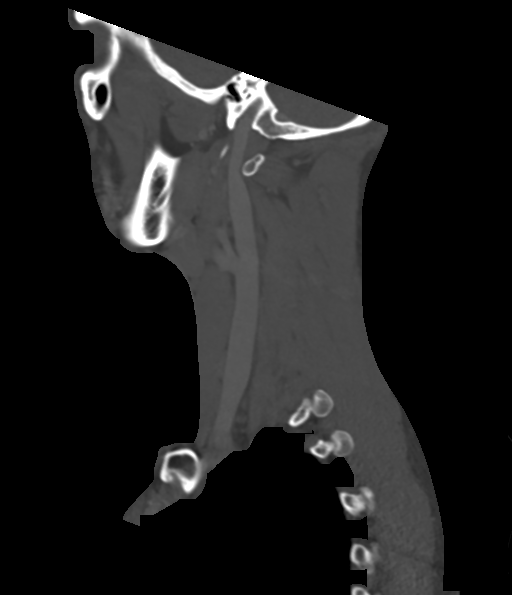

[Series 7: cor neck · coronal · 0.42mm/px · 3 of 114 slices shown]
[im 35/114  bone]
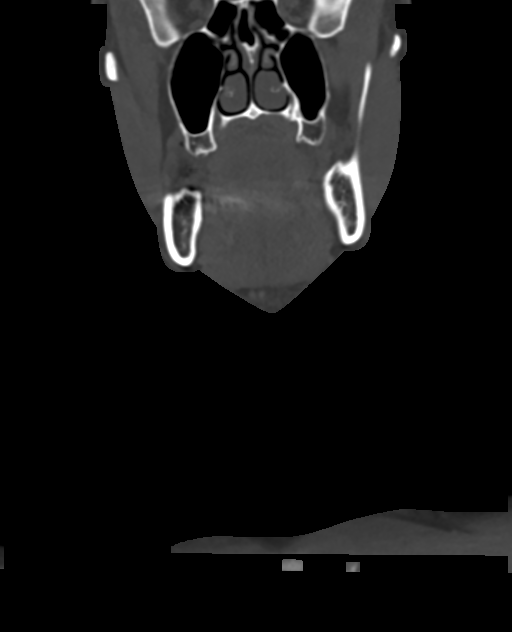
[im 50/114  bone]
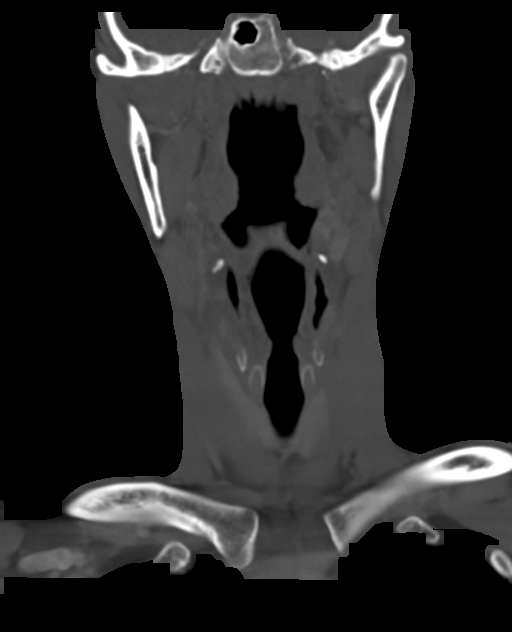
[im 65/114  bone]
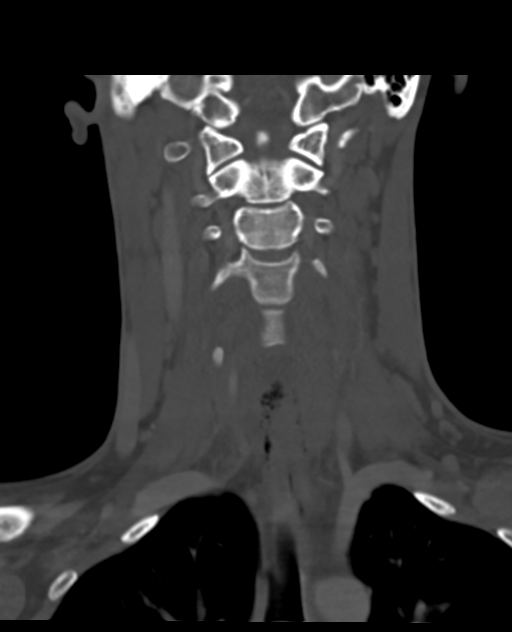

[Series 8: ax oropharynx · axial · 0.33mm/px · z∈[-220,-98]mm · 3 of 132 slices shown, 4 images]
[im 33/132  soft-tissue]
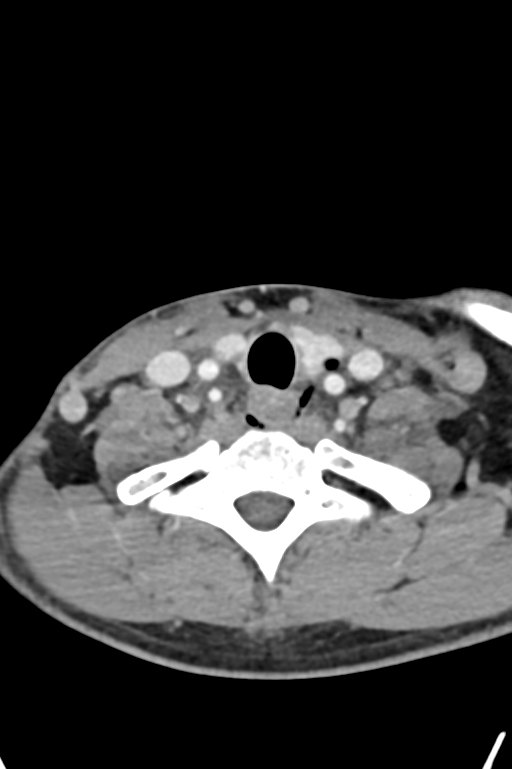
[im 33/132  bone]
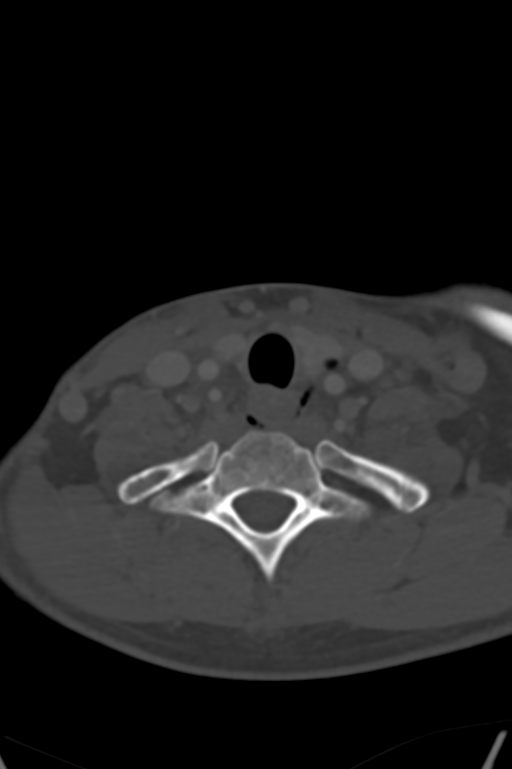
[im 66/132  bone]
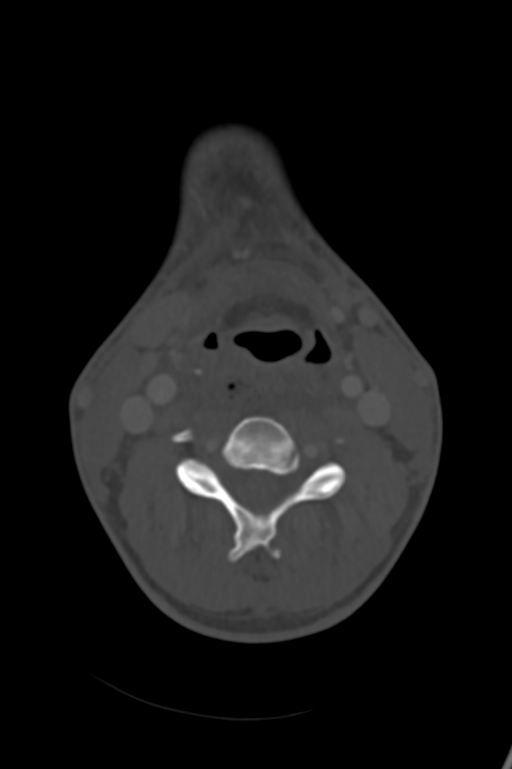
[im 99/132  bone]
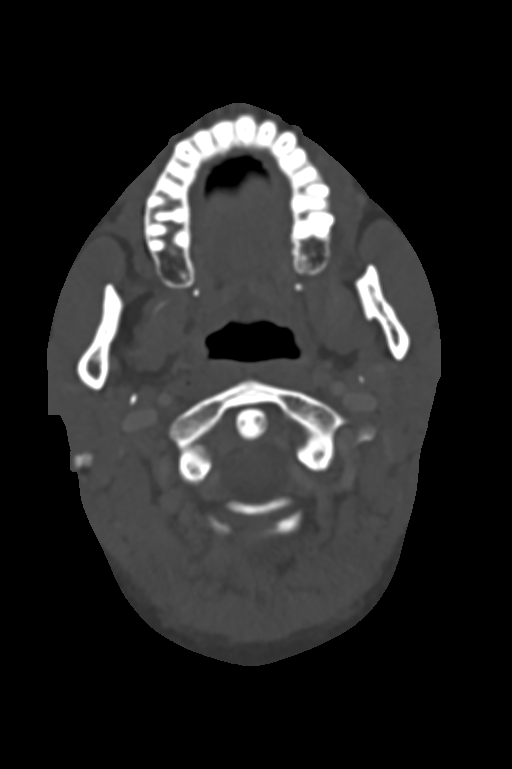

[11 of 35 positions shown; findings below may reference images not displayed]

FINDINGS: Pharynx and larynx: The hyoid bone and thyroid cartilage seem to
remain intact. However, there is abnormal retropharyngeal space
fluid and gas tracking throughout the neck. See sagittal image 57.
The retropharyngeal effusion and/or edema tracks laterally from the
retropharyngeal space to involve both carotid spaces. See series 3,
image 49.

Gas tracks inferiorly to the thoracic inlet mostly along the
cervical esophagus, but the volume of gas dissipates caudally and
there is no superior pneumomediastinum.

There is an unusual contour of the posterior wall of the lower
oropharynx or hypopharynx on series 3, image 47. But otherwise no
discrete pharyngeal or laryngeal contour abnormality. Laryngeal
cartilages appear symmetric and within normal limits.

There is mild inflammation in the superior right parapharyngeal
space, where as the superior left parapharyngeal space remains
normal.

Salivary glands: Negative sublingual space, sublingual glands. The
submandibular glands appear symmetric and within normal limits
although there is some fluid or edema in the posterior right
submandibular space. Parotid glands appear symmetric and negative.

Thyroid: Negative aside from trace adjacent gas.

Lymph nodes: Right level 2 lymph nodes are asymmetrically enlarged
and appear reactive, individually up to 11 millimeters short axis.
No cystic or necrotic nodes.

Vascular: Good intravascular contrast. Three vessel arch
configuration. Right CCA and carotid bifurcation are normal.
Cervical right ICA is within normal limits. Left CCA and left
carotid bifurcation are normal. Cervical left ICA is within normal
limits. Both internal jugular veins are patent. The vertebral
arteries are codominant and appear within normal limits to the skull
base. The right vertebral has a late entry into the cervical
transverse foramen.

Limited intracranial: Negative.

Visualized orbits: Negative.

Mastoids and visualized paranasal sinuses: Clear throughout.

Skeleton: Mandible intact. No acute dental abnormality identified.
Straightening of cervical lordosis but otherwise normal cervical
vertebral alignment. No osseous abnormality identified.

Upper chest: No pneumothorax in the visible upper lungs are clear.
Negative visible superior mediastinum, no fluid or
pneumomediastinum.
IMPRESSION: 1. Positive for evidence of penetrating trauma to the pharynx or
larynx with abnormal retropharyngeal space gas and fluid/edema
tracking throughout the neck. The gas dissipates at the thoracic
inlet and the visible upper chest is negative.

The injury site might be the posterior pharyngeal wall to the right
of midline on series 3, image 47 where an unusual mucosal contour is
noted. The hyoid bone and thyroid cartilage seem to remain intact.

2. Secondary inflammation and/or fluid extending laterally from the
retropharyngeal space into the bilateral parapharyngeal and carotid
spaces. No vascular abnormality identified. Mild inflammation also
in the right submandibular space.

3. No other acute traumatic injury identified.
